# Patient Record
Sex: Male | Born: 1935 | Race: White | Hispanic: No | Smoking: Former smoker
Health system: Southern US, Community
[De-identification: ages and names within clinical notes are randomized; demographics above are authoritative.]

## PROBLEM LIST (undated history)

## (undated) DIAGNOSIS — I10 Essential (primary) hypertension: Secondary | ICD-10-CM

## (undated) DIAGNOSIS — K5792 Diverticulitis of intestine, part unspecified, without perforation or abscess without bleeding: Secondary | ICD-10-CM

## (undated) DIAGNOSIS — J449 Chronic obstructive pulmonary disease, unspecified: Secondary | ICD-10-CM

## (undated) DIAGNOSIS — K589 Irritable bowel syndrome without diarrhea: Secondary | ICD-10-CM

## (undated) HISTORY — PX: COLONOSCOPY: SHX174

---

## 2005-09-18 ENCOUNTER — Ambulatory Visit: Payer: Self-pay | Admitting: Unknown Physician Specialty

## 2005-09-24 ENCOUNTER — Ambulatory Visit: Payer: Self-pay | Admitting: Unknown Physician Specialty

## 2006-03-05 ENCOUNTER — Ambulatory Visit: Payer: Self-pay | Admitting: Unknown Physician Specialty

## 2006-05-28 ENCOUNTER — Ambulatory Visit: Payer: Self-pay | Admitting: Ophthalmology

## 2006-06-03 ENCOUNTER — Ambulatory Visit: Payer: Self-pay | Admitting: Ophthalmology

## 2006-11-11 ENCOUNTER — Ambulatory Visit: Payer: Self-pay | Admitting: Unknown Physician Specialty

## 2008-06-30 ENCOUNTER — Ambulatory Visit: Payer: Self-pay | Admitting: Family Medicine

## 2010-01-24 ENCOUNTER — Ambulatory Visit: Payer: Self-pay | Admitting: Family Medicine

## 2010-12-21 ENCOUNTER — Ambulatory Visit: Payer: Self-pay

## 2011-01-09 ENCOUNTER — Ambulatory Visit: Payer: Self-pay | Admitting: Pain Medicine

## 2011-01-22 ENCOUNTER — Ambulatory Visit: Payer: Self-pay | Admitting: Pain Medicine

## 2011-03-23 ENCOUNTER — Ambulatory Visit: Payer: Self-pay | Admitting: Family Medicine

## 2011-04-12 ENCOUNTER — Ambulatory Visit: Payer: Self-pay | Admitting: Pain Medicine

## 2011-05-07 ENCOUNTER — Ambulatory Visit: Payer: Self-pay | Admitting: Pain Medicine

## 2011-05-14 ENCOUNTER — Ambulatory Visit: Payer: Self-pay | Admitting: Unknown Physician Specialty

## 2012-12-04 ENCOUNTER — Ambulatory Visit: Payer: Self-pay | Admitting: Specialist

## 2012-12-16 ENCOUNTER — Ambulatory Visit: Payer: Self-pay | Admitting: Specialist

## 2012-12-31 ENCOUNTER — Ambulatory Visit: Payer: Self-pay | Admitting: Cardiothoracic Surgery

## 2013-01-08 ENCOUNTER — Other Ambulatory Visit (HOSPITAL_COMMUNITY): Payer: Self-pay | Admitting: Cardiothoracic Surgery

## 2013-01-08 DIAGNOSIS — R918 Other nonspecific abnormal finding of lung field: Secondary | ICD-10-CM

## 2013-01-08 DIAGNOSIS — C3490 Malignant neoplasm of unspecified part of unspecified bronchus or lung: Secondary | ICD-10-CM

## 2013-01-26 ENCOUNTER — Encounter (HOSPITAL_COMMUNITY): Payer: Self-pay | Admitting: Pharmacy Technician

## 2013-01-27 ENCOUNTER — Other Ambulatory Visit: Payer: Self-pay | Admitting: Radiology

## 2013-01-28 ENCOUNTER — Other Ambulatory Visit: Payer: Self-pay | Admitting: Radiology

## 2013-01-29 ENCOUNTER — Encounter (HOSPITAL_COMMUNITY): Payer: Self-pay

## 2013-01-29 ENCOUNTER — Ambulatory Visit (HOSPITAL_COMMUNITY)
Admission: RE | Admit: 2013-01-29 | Discharge: 2013-01-29 | Disposition: A | Discharge status: Home / Self Care | Payer: Medicare Other | Source: Ambulatory Visit | Attending: Cardiothoracic Surgery | Admitting: Cardiothoracic Surgery

## 2013-01-29 DIAGNOSIS — C3490 Malignant neoplasm of unspecified part of unspecified bronchus or lung: Secondary | ICD-10-CM | POA: Insufficient documentation

## 2013-01-29 DIAGNOSIS — J449 Chronic obstructive pulmonary disease, unspecified: Secondary | ICD-10-CM | POA: Insufficient documentation

## 2013-01-29 DIAGNOSIS — I10 Essential (primary) hypertension: Secondary | ICD-10-CM | POA: Insufficient documentation

## 2013-01-29 DIAGNOSIS — R911 Solitary pulmonary nodule: Secondary | ICD-10-CM | POA: Insufficient documentation

## 2013-01-29 DIAGNOSIS — R918 Other nonspecific abnormal finding of lung field: Secondary | ICD-10-CM

## 2013-01-29 DIAGNOSIS — J4489 Other specified chronic obstructive pulmonary disease: Secondary | ICD-10-CM | POA: Insufficient documentation

## 2013-01-29 HISTORY — DX: Chronic obstructive pulmonary disease, unspecified: J44.9

## 2013-01-29 HISTORY — DX: Essential (primary) hypertension: I10

## 2013-01-29 HISTORY — DX: Irritable bowel syndrome, unspecified: K58.9

## 2013-01-29 HISTORY — DX: Diverticulitis of intestine, part unspecified, without perforation or abscess without bleeding: K57.92

## 2013-01-29 LAB — CBC
HCT: 51.7 % (ref 39.0–52.0)
MCH: 32.4 pg (ref 26.0–34.0)
MCHC: 32.9 g/dL (ref 30.0–36.0)
MCV: 98.7 fL (ref 78.0–100.0)
Platelets: 272 10*3/uL (ref 150–400)
RDW: 12.8 % (ref 11.5–15.5)
WBC: 8.8 10*3/uL (ref 4.0–10.5)

## 2013-01-29 MED ORDER — FENTANYL CITRATE 0.05 MG/ML IJ SOLN
INTRAMUSCULAR | Status: AC | PRN
  Administered 2013-01-29: 100 ug via INTRAVENOUS

## 2013-01-29 MED ORDER — MIDAZOLAM HCL 2 MG/2ML IJ SOLN
INTRAMUSCULAR | Status: AC
  Filled 2013-01-29: qty 4

## 2013-01-29 MED ORDER — SODIUM CHLORIDE 0.9 % IV SOLN
Freq: Once | INTRAVENOUS | Status: AC
  Administered 2013-01-29: 11:00:00 via INTRAVENOUS

## 2013-01-29 MED ORDER — FENTANYL CITRATE 0.05 MG/ML IJ SOLN
INTRAMUSCULAR | Status: AC
  Filled 2013-01-29: qty 4

## 2013-01-29 MED ORDER — MIDAZOLAM HCL 2 MG/2ML IJ SOLN
INTRAMUSCULAR | Status: AC | PRN
  Administered 2013-01-29: 2 mg via INTRAVENOUS

## 2013-01-29 MED ORDER — HYDROCODONE-ACETAMINOPHEN 5-325 MG PO TABS
1.0000 | ORAL_TABLET | ORAL | Status: DC | PRN
  Filled 2013-01-29: qty 2

## 2013-01-29 NOTE — H&P (Signed)
Agree.  For biopsy of lingular lung nodule today.

## 2013-01-29 NOTE — H&P (Signed)
Russell Torres is an 79 y.o. male.   Chief Complaint: 2 incidents of hemoptysis over 1 mo ago None since Continued cough; sob Quit smoking 1.5 yrs ago Ct reveals B lung nodules Scheduled now for L lingular mass biopsy  HPI: No Ca Hx; COPD; HTN; IBS; Divertic; prev smoker  Past Medical History  Diagnosis Date  . COPD (chronic obstructive pulmonary disease)   . HTN (hypertension)   . Diverticulitis   . IBS (irritable bowel syndrome)     Past Surgical History  Procedure Date  . Colonoscopy     No family history on file. Social History:  reports that he quit smoking about 4 months ago. His smoking use included Cigarettes. He smoked 1 pack per day. He does not have any smokeless tobacco history on file. He reports that he drinks about 2.5 ounces of alcohol per week. He reports that he does not use illicit drugs.  Allergies: No Known Allergies   (Not in a hospital admission)  Results for orders placed during the hospital encounter of 01/29/13 (from the past 48 hour(s))  APTT     Status: Normal   Collection Time   01/29/13 11:25 AM      Component Value Range Comment   aPTT 31  24 - 37 seconds   CBC     Status: Normal   Collection Time   01/29/13 11:25 AM      Component Value Range Comment   WBC 8.8  4.0 - 10.5 K/uL    RBC 5.24  4.22 - 5.81 MIL/uL    Hemoglobin 17.0  13.0 - 17.0 g/dL    HCT 16.1  09.6 - 04.5 %    MCV 98.7  78.0 - 100.0 fL    MCH 32.4  26.0 - 34.0 pg    MCHC 32.9  30.0 - 36.0 g/dL    RDW 40.9  81.1 - 91.4 %    Platelets 272  150 - 400 K/uL   PROTIME-INR     Status: Normal   Collection Time   01/29/13 11:25 AM      Component Value Range Comment   Prothrombin Time 11.7  11.6 - 15.2 seconds    INR 0.86  0.00 - 1.49    No results found.  Review of Systems  Respiratory: Positive for cough, sputum production, shortness of breath and wheezing.   Cardiovascular: Positive for chest pain.  Gastrointestinal: Negative for nausea, vomiting and abdominal pain.   Neurological: Negative for headaches.    There were no vitals taken for this visit. Physical Exam  Constitutional: He is oriented to person, place, and time. He appears well-developed.  Cardiovascular: Normal rate, regular rhythm and normal heart sounds.   No murmur heard. Respiratory: Effort normal. He has wheezes.  GI: Soft. Bowel sounds are normal. He exhibits distension. There is no tenderness.  Musculoskeletal: Normal range of motion.  Neurological: He is alert and oriented to person, place, and time.  Skin: Skin is warm and dry.  Psychiatric: He has a normal mood and affect. His behavior is normal. Judgment and thought content normal.     Assessment/Plan B Lung nodules Hx hemoptysis 1 mo ago CT and PET + nodules Scheduled for L lingular mass bx Pt aware of procedure benefits and risks and agreeable to proceed Consent signed and in chart  Anasophia Pecor A 01/29/2013, 12:26 PM

## 2013-01-29 NOTE — Procedures (Signed)
Procedure:  CT guided core biopsy of left lung nodule Findings:  1.5 cm nodule in lingula sampled with 18 G core bx x 2 via 17 G needle.  Focal hemorrhage.  No PTX.

## 2013-02-05 ENCOUNTER — Ambulatory Visit: Payer: Self-pay | Admitting: Cardiothoracic Surgery

## 2013-02-17 ENCOUNTER — Ambulatory Visit: Payer: Self-pay

## 2013-02-17 LAB — COMPREHENSIVE METABOLIC PANEL
Albumin: 3.5 g/dL (ref 3.4–5.0)
Alkaline Phosphatase: 61 U/L (ref 50–136)
BUN: 14 mg/dL (ref 7–18)
Calcium, Total: 8.9 mg/dL (ref 8.5–10.1)
Chloride: 107 mmol/L (ref 98–107)
EGFR (Non-African Amer.): >60
Osmolality: 283 (ref 275–301)
Potassium: 4 mmol/L (ref 3.5–5.1)
SGOT(AST): 49 U/L — ABNORMAL HIGH (ref 15–37)

## 2013-02-17 LAB — APTT: Activated PTT: 30.9 secs (ref 23.6–35.9)

## 2013-02-17 LAB — CBC WITH DIFFERENTIAL/PLATELET
Basophil %: 0.5 %
HCT: 50.7 % (ref 40.0–52.0)
MCHC: 33.1 g/dL (ref 32.0–36.0)
MCV: 100 fL (ref 80–100)
Monocyte #: 0.6 x10 3/mm (ref 0.2–1.0)
RDW: 14.1 % (ref 11.5–14.5)
WBC: 7.6 10*3/uL (ref 3.8–10.6)

## 2013-02-18 ENCOUNTER — Ambulatory Visit: Payer: Self-pay

## 2013-02-23 LAB — COMPREHENSIVE METABOLIC PANEL
Albumin: 3.5 g/dL (ref 3.4–5.0)
Alkaline Phosphatase: 68 U/L (ref 50–136)
Anion Gap: 7 (ref 7–16)
BUN: 17 mg/dL (ref 7–18)
Bilirubin,Total: 0.3 mg/dL (ref 0.2–1.0)
Calcium, Total: 9.1 mg/dL (ref 8.5–10.1)
Chloride: 104 mmol/L (ref 98–107)
Co2: 28 mmol/L (ref 21–32)
Creatinine: 1.39 mg/dL — ABNORMAL HIGH (ref 0.60–1.30)
EGFR (African American): 57 — ABNORMAL LOW
EGFR (Non-African Amer.): 49 — ABNORMAL LOW
Glucose: 149 mg/dL — ABNORMAL HIGH (ref 65–99)
Osmolality: 282 (ref 275–301)
Potassium: 4.5 mmol/L (ref 3.5–5.1)
SGOT(AST): 51 U/L — ABNORMAL HIGH (ref 15–37)
SGPT (ALT): 44 U/L (ref 12–78)
Sodium: 139 mmol/L (ref 136–145)
Total Protein: 7.8 g/dL (ref 6.4–8.2)

## 2013-02-23 LAB — CBC CANCER CENTER
Basophil #: 0 x10 3/mm (ref 0.0–0.1)
Basophil %: 0.3 %
Eosinophil #: 0 x10 3/mm (ref 0.0–0.7)
Eosinophil %: 0 %
HCT: 50.5 % (ref 40.0–52.0)
HGB: 16.9 g/dL (ref 13.0–18.0)
Lymphocyte #: 0.5 x10 3/mm — ABNORMAL LOW (ref 1.0–3.6)
Lymphocyte %: 8.4 %
MCH: 33.3 pg (ref 26.0–34.0)
MCHC: 33.5 g/dL (ref 32.0–36.0)
MCV: 99 fL (ref 80–100)
Monocyte #: 0 x10 3/mm — ABNORMAL LOW (ref 0.2–1.0)
Monocyte %: 0.7 %
Neutrophil #: 5.5 x10 3/mm (ref 1.4–6.5)
Neutrophil %: 90.6 %
Platelet: 196 x10 3/mm (ref 150–440)
RBC: 5.08 10*6/uL (ref 4.40–5.90)
RDW: 13.6 % (ref 11.5–14.5)
WBC: 6.1 x10 3/mm (ref 3.8–10.6)

## 2013-02-28 ENCOUNTER — Ambulatory Visit: Payer: Self-pay | Admitting: Cardiothoracic Surgery

## 2013-03-02 LAB — CBC CANCER CENTER
Basophil %: 0.5 %
Eosinophil %: 5.3 %
HCT: 47.9 % (ref 40.0–52.0)
Lymphocyte %: 22.2 %
MCV: 99 fL (ref 80–100)
Monocyte %: 12.3 %
Neutrophil #: 3.8 x10 3/mm (ref 1.4–6.5)
RBC: 4.85 10*6/uL (ref 4.40–5.90)
RDW: 13.6 % (ref 11.5–14.5)
WBC: 6.4 x10 3/mm (ref 3.8–10.6)

## 2013-03-02 LAB — CALCIUM: Calcium, Total: 9.3 mg/dL (ref 8.5–10.1)

## 2013-03-02 LAB — POTASSIUM: Potassium: 4.5 mmol/L (ref 3.5–5.1)

## 2013-03-09 LAB — CBC CANCER CENTER
Basophil #: 0.1 x10 3/mm (ref 0.0–0.1)
Basophil %: 0.9 %
HCT: 47.1 % (ref 40.0–52.0)
Lymphocyte #: 1.7 x10 3/mm (ref 1.0–3.6)
MCHC: 32.4 g/dL (ref 32.0–36.0)
MCV: 99 fL (ref 80–100)
Monocyte #: 1 x10 3/mm (ref 0.2–1.0)
Monocyte %: 6.7 %
Neutrophil #: 12.1 x10 3/mm — ABNORMAL HIGH (ref 1.4–6.5)
Neutrophil %: 80.5 %
Platelet: 196 x10 3/mm (ref 150–440)
RDW: 13.9 % (ref 11.5–14.5)
WBC: 15 x10 3/mm — ABNORMAL HIGH (ref 3.8–10.6)

## 2013-03-09 LAB — BASIC METABOLIC PANEL
Chloride: 101 mmol/L (ref 98–107)
Creatinine: 1.37 mg/dL — ABNORMAL HIGH (ref 0.60–1.30)
Glucose: 114 mg/dL — ABNORMAL HIGH (ref 65–99)
Osmolality: 277 (ref 275–301)
Sodium: 139 mmol/L (ref 136–145)

## 2013-03-09 LAB — HEPATIC FUNCTION PANEL A (ARMC)
Bilirubin,Total: 0.3 mg/dL (ref 0.2–1.0)
SGOT(AST): 16 U/L (ref 15–37)
SGPT (ALT): 25 U/L (ref 12–78)

## 2013-03-16 LAB — CBC CANCER CENTER
Basophil #: 0.1 x10 3/mm (ref 0.0–0.1)
Basophil %: 1.9 %
Eosinophil %: 0.1 %
HCT: 43.8 % (ref 40.0–52.0)
Lymphocyte %: 8.2 %
MCV: 97 fL (ref 80–100)
Monocyte %: 0.2 %
Neutrophil #: 6.5 x10 3/mm (ref 1.4–6.5)
Neutrophil %: 89.6 %
Platelet: 400 x10 3/mm (ref 150–440)
RBC: 4.5 10*6/uL (ref 4.40–5.90)
RDW: 13.4 % (ref 11.5–14.5)

## 2013-03-16 LAB — BASIC METABOLIC PANEL
Anion Gap: 8 (ref 7–16)
BUN: 14 mg/dL (ref 7–18)
Co2: 28 mmol/L (ref 21–32)
EGFR (African American): 57 — ABNORMAL LOW
EGFR (Non-African Amer.): 49 — ABNORMAL LOW
Osmolality: 281 (ref 275–301)

## 2013-03-23 LAB — CBC CANCER CENTER
Basophil %: 1.1 %
Eosinophil #: 0.4 x10 3/mm (ref 0.0–0.7)
HCT: 44.7 % (ref 40.0–52.0)
HGB: 15 g/dL (ref 13.0–18.0)
MCHC: 33.6 g/dL (ref 32.0–36.0)
MCV: 97 fL (ref 80–100)
Monocyte %: 17.6 %
Neutrophil %: 45.1 %
Platelet: 142 x10 3/mm — ABNORMAL LOW (ref 150–440)
RBC: 4.61 10*6/uL (ref 4.40–5.90)
RDW: 13.5 % (ref 11.5–14.5)
WBC: 4.8 x10 3/mm (ref 3.8–10.6)

## 2013-03-30 LAB — CBC CANCER CENTER
Basophil #: 0.2 x10 3/mm — ABNORMAL HIGH (ref 0.0–0.1)
Eosinophil #: 0.3 x10 3/mm (ref 0.0–0.7)
Eosinophil %: 2.2 %
Lymphocyte #: 1.9 x10 3/mm (ref 1.0–3.6)
MCHC: 32.4 g/dL (ref 32.0–36.0)
MCV: 97 fL (ref 80–100)
Monocyte %: 4.5 %
RDW: 13.3 % (ref 11.5–14.5)
WBC: 14.3 x10 3/mm — ABNORMAL HIGH (ref 3.8–10.6)

## 2013-03-30 LAB — HEPATIC FUNCTION PANEL A (ARMC)
Albumin: 2.9 g/dL — ABNORMAL LOW (ref 3.4–5.0)
Bilirubin,Total: 0.2 mg/dL (ref 0.2–1.0)
SGOT(AST): 14 U/L — ABNORMAL LOW (ref 15–37)
SGPT (ALT): 20 U/L (ref 12–78)
Total Protein: 7.1 g/dL (ref 6.4–8.2)

## 2013-03-30 LAB — BASIC METABOLIC PANEL
Anion Gap: 9 (ref 7–16)
BUN: 14 mg/dL (ref 7–18)
Co2: 29 mmol/L (ref 21–32)
EGFR (African American): >60
Glucose: 102 mg/dL — ABNORMAL HIGH (ref 65–99)
Osmolality: 280 (ref 275–301)
Potassium: 4.2 mmol/L (ref 3.5–5.1)
Sodium: 140 mmol/L (ref 136–145)

## 2013-03-31 ENCOUNTER — Ambulatory Visit: Payer: Self-pay | Admitting: Cardiothoracic Surgery

## 2013-04-06 LAB — CBC CANCER CENTER
Basophil %: 0.8 %
Eosinophil #: 0 x10 3/mm (ref 0.0–0.7)
HCT: 42.7 % (ref 40.0–52.0)
Lymphocyte %: 8.8 %
MCH: 31.8 pg (ref 26.0–34.0)
MCHC: 32.9 g/dL (ref 32.0–36.0)
Monocyte #: 0.1 x10 3/mm — ABNORMAL LOW (ref 0.2–1.0)
Monocyte %: 0.5 %
Neutrophil #: 9.4 x10 3/mm — ABNORMAL HIGH (ref 1.4–6.5)
Neutrophil %: 89.9 %
Platelet: 450 x10 3/mm — ABNORMAL HIGH (ref 150–440)
RBC: 4.42 10*6/uL (ref 4.40–5.90)
WBC: 10.5 x10 3/mm (ref 3.8–10.6)

## 2013-04-06 LAB — BASIC METABOLIC PANEL
BUN: 21 mg/dL — ABNORMAL HIGH (ref 7–18)
Chloride: 103 mmol/L (ref 98–107)
Co2: 28 mmol/L (ref 21–32)
Creatinine: 1.48 mg/dL — ABNORMAL HIGH (ref 0.60–1.30)
EGFR (African American): 53 — ABNORMAL LOW
EGFR (Non-African Amer.): 45 — ABNORMAL LOW
Glucose: 176 mg/dL — ABNORMAL HIGH (ref 65–99)

## 2013-04-13 LAB — CBC CANCER CENTER
Eosinophil #: 0.4 x10 3/mm (ref 0.0–0.7)
Eosinophil %: 7.3 %
HCT: 41.9 % (ref 40.0–52.0)
HGB: 13.8 g/dL (ref 13.0–18.0)
Lymphocyte #: 1.8 x10 3/mm (ref 1.0–3.6)
Lymphocyte %: 31.3 %
MCH: 31.9 pg (ref 26.0–34.0)
MCV: 97 fL (ref 80–100)
Monocyte #: 0.8 x10 3/mm (ref 0.2–1.0)
Neutrophil #: 2.6 x10 3/mm (ref 1.4–6.5)
Neutrophil %: 46.7 %
RBC: 4.33 10*6/uL — ABNORMAL LOW (ref 4.40–5.90)
RDW: 13.6 % (ref 11.5–14.5)
WBC: 5.6 x10 3/mm (ref 3.8–10.6)

## 2013-04-20 LAB — HEPATIC FUNCTION PANEL A (ARMC)
Albumin: 3.4 g/dL (ref 3.4–5.0)
Alkaline Phosphatase: 101 U/L (ref 50–136)
Bilirubin,Total: 0.2 mg/dL (ref 0.2–1.0)
SGPT (ALT): 25 U/L (ref 12–78)
Total Protein: 7.1 g/dL (ref 6.4–8.2)

## 2013-04-20 LAB — CBC CANCER CENTER
Basophil #: 0.1 x10 3/mm (ref 0.0–0.1)
Basophil %: 0.6 %
HGB: 13.1 g/dL (ref 13.0–18.0)
Lymphocyte #: 2.1 x10 3/mm (ref 1.0–3.6)
MCH: 31.5 pg (ref 26.0–34.0)
MCHC: 32.4 g/dL (ref 32.0–36.0)
MCV: 97 fL (ref 80–100)
Monocyte #: 0.8 x10 3/mm (ref 0.2–1.0)
Neutrophil #: 8.8 x10 3/mm — ABNORMAL HIGH (ref 1.4–6.5)
Neutrophil %: 72.8 %
Platelet: 207 x10 3/mm (ref 150–440)
RBC: 4.17 10*6/uL — ABNORMAL LOW (ref 4.40–5.90)

## 2013-04-20 LAB — CREATININE, SERUM
Creatinine: 1.15 mg/dL (ref 0.60–1.30)
EGFR (African American): >60

## 2013-04-27 LAB — CBC CANCER CENTER
Basophil #: 0 x10 3/mm (ref 0.0–0.1)
Basophil %: 0.5 %
Eosinophil #: 0 x10 3/mm (ref 0.0–0.7)
Eosinophil %: 0 %
HCT: 40.5 % (ref 40.0–52.0)
HGB: 13.5 g/dL (ref 13.0–18.0)
Lymphocyte #: 0.7 x10 3/mm — ABNORMAL LOW (ref 1.0–3.6)
Lymphocyte %: 24 %
MCH: 32 pg (ref 26.0–34.0)
MCHC: 33.3 g/dL (ref 32.0–36.0)
Monocyte #: 0 x10 3/mm — ABNORMAL LOW (ref 0.2–1.0)
Neutrophil #: 2.1 x10 3/mm (ref 1.4–6.5)
Neutrophil %: 74.7 %
Platelet: 341 x10 3/mm (ref 150–440)
RBC: 4.21 10*6/uL — ABNORMAL LOW (ref 4.40–5.90)
WBC: 2.8 x10 3/mm — ABNORMAL LOW (ref 3.8–10.6)

## 2013-04-27 LAB — BASIC METABOLIC PANEL
Anion Gap: 11 (ref 7–16)
BUN: 16 mg/dL (ref 7–18)
Chloride: 101 mmol/L (ref 98–107)
Co2: 28 mmol/L (ref 21–32)
Creatinine: 1.38 mg/dL — ABNORMAL HIGH (ref 0.60–1.30)
EGFR (Non-African Amer.): 49 — ABNORMAL LOW
Glucose: 163 mg/dL — ABNORMAL HIGH (ref 65–99)

## 2013-04-30 ENCOUNTER — Ambulatory Visit: Payer: Self-pay | Admitting: Cardiothoracic Surgery

## 2013-05-04 LAB — CBC CANCER CENTER
Basophil #: 0 x10 3/mm (ref 0.0–0.1)
HGB: 12.3 g/dL — ABNORMAL LOW (ref 13.0–18.0)
MCH: 31.5 pg (ref 26.0–34.0)
Monocyte #: 0.7 x10 3/mm (ref 0.2–1.0)
Platelet: 115 x10 3/mm — ABNORMAL LOW (ref 150–440)
RBC: 3.91 10*6/uL — ABNORMAL LOW (ref 4.40–5.90)

## 2013-05-11 LAB — CBC CANCER CENTER
Basophil #: 0.1 x10 3/mm (ref 0.0–0.1)
Basophil %: 0.4 %
Eosinophil %: 3.1 %
HCT: 34.8 % — ABNORMAL LOW (ref 40.0–52.0)
Lymphocyte #: 1.9 x10 3/mm (ref 1.0–3.6)
Lymphocyte %: 13.6 %
MCV: 97 fL (ref 80–100)
Monocyte #: 1 x10 3/mm (ref 0.2–1.0)
RBC: 3.6 10*6/uL — ABNORMAL LOW (ref 4.40–5.90)
RDW: 15 % — ABNORMAL HIGH (ref 11.5–14.5)
WBC: 14 x10 3/mm — ABNORMAL HIGH (ref 3.8–10.6)

## 2013-05-18 LAB — HEPATIC FUNCTION PANEL A (ARMC)
Alkaline Phosphatase: 80 U/L (ref 50–136)
Bilirubin, Direct: 0.1 mg/dL (ref 0.00–0.20)
Bilirubin,Total: 0.4 mg/dL (ref 0.2–1.0)
SGOT(AST): 16 U/L (ref 15–37)
SGPT (ALT): 21 U/L (ref 12–78)
Total Protein: 7.1 g/dL (ref 6.4–8.2)

## 2013-05-18 LAB — CBC CANCER CENTER
Basophil #: 0 x10 3/mm (ref 0.0–0.1)
Eosinophil #: 0.2 x10 3/mm (ref 0.0–0.7)
HGB: 12.3 g/dL — ABNORMAL LOW (ref 13.0–18.0)
Lymphocyte #: 2.2 x10 3/mm (ref 1.0–3.6)
MCH: 32.3 pg (ref 26.0–34.0)
MCHC: 33.3 g/dL (ref 32.0–36.0)
MCV: 97 fL (ref 80–100)
Monocyte %: 9.7 %
Neutrophil #: 4.7 x10 3/mm (ref 1.4–6.5)
RBC: 3.8 10*6/uL — ABNORMAL LOW (ref 4.40–5.90)
WBC: 7.8 x10 3/mm (ref 3.8–10.6)

## 2013-05-18 LAB — BASIC METABOLIC PANEL
Chloride: 102 mmol/L (ref 98–107)
Co2: 30 mmol/L (ref 21–32)
Glucose: 106 mg/dL — ABNORMAL HIGH (ref 65–99)
Osmolality: 284 (ref 275–301)
Potassium: 4 mmol/L (ref 3.5–5.1)
Sodium: 142 mmol/L (ref 136–145)

## 2013-05-26 LAB — CBC CANCER CENTER
Basophil #: 0.1 x10 3/mm (ref 0.0–0.1)
Basophil %: 0.9 %
HCT: 32.4 % — ABNORMAL LOW (ref 40.0–52.0)
HGB: 10.6 g/dL — ABNORMAL LOW (ref 13.0–18.0)
Lymphocyte %: 17.7 %
MCH: 32 pg (ref 26.0–34.0)
MCV: 98 fL (ref 80–100)
Monocyte #: 1.5 x10 3/mm — ABNORMAL HIGH (ref 0.2–1.0)
Monocyte %: 11.9 %
Neutrophil %: 66.6 %
RBC: 3.32 10*6/uL — ABNORMAL LOW (ref 4.40–5.90)

## 2013-05-31 ENCOUNTER — Ambulatory Visit: Payer: Self-pay | Admitting: Cardiothoracic Surgery

## 2013-06-01 LAB — CBC CANCER CENTER
Basophil %: 0.2 %
Eosinophil #: 0.5 x10 3/mm (ref 0.0–0.7)
HCT: 34.5 % — ABNORMAL LOW (ref 40.0–52.0)
HGB: 11.4 g/dL — ABNORMAL LOW (ref 13.0–18.0)
Lymphocyte #: 2.3 x10 3/mm (ref 1.0–3.6)
Lymphocyte %: 20 %
MCHC: 33.1 g/dL (ref 32.0–36.0)
Monocyte #: 0.9 x10 3/mm (ref 0.2–1.0)
Monocyte %: 8.1 %
RBC: 3.5 10*6/uL — ABNORMAL LOW (ref 4.40–5.90)

## 2013-06-08 LAB — HEPATIC FUNCTION PANEL A (ARMC)
Albumin: 3.4 g/dL (ref 3.4–5.0)
Alkaline Phosphatase: 77 U/L (ref 50–136)
Bilirubin,Total: 0.4 mg/dL (ref 0.2–1.0)
SGOT(AST): 15 U/L (ref 15–37)
SGPT (ALT): 20 U/L (ref 12–78)
Total Protein: 7.2 g/dL (ref 6.4–8.2)

## 2013-06-08 LAB — CBC CANCER CENTER
Basophil #: 0.1 x10 3/mm (ref 0.0–0.1)
Eosinophil #: 0 x10 3/mm (ref 0.0–0.7)
Eosinophil %: 0 %
HGB: 12.5 g/dL — ABNORMAL LOW (ref 13.0–18.0)
Lymphocyte #: 0.7 x10 3/mm — ABNORMAL LOW (ref 1.0–3.6)
MCH: 33.7 pg (ref 26.0–34.0)
MCV: 99 fL (ref 80–100)
Monocyte %: 0.5 %
Neutrophil #: 5.5 x10 3/mm (ref 1.4–6.5)
Platelet: 380 x10 3/mm (ref 150–440)
RBC: 3.73 10*6/uL — ABNORMAL LOW (ref 4.40–5.90)
RDW: 17 % — ABNORMAL HIGH (ref 11.5–14.5)

## 2013-06-08 LAB — BASIC METABOLIC PANEL
Calcium, Total: 9.2 mg/dL (ref 8.5–10.1)
Co2: 29 mmol/L (ref 21–32)
EGFR (African American): 53 — ABNORMAL LOW
Glucose: 163 mg/dL — ABNORMAL HIGH (ref 65–99)
Osmolality: 282 (ref 275–301)
Potassium: 4.3 mmol/L (ref 3.5–5.1)
Sodium: 138 mmol/L (ref 136–145)

## 2013-06-15 LAB — CBC CANCER CENTER
Basophil #: 0.1 x10 3/mm (ref 0.0–0.1)
Eosinophil %: 6.4 %
HCT: 35.1 % — ABNORMAL LOW (ref 40.0–52.0)
HGB: 12 g/dL — ABNORMAL LOW (ref 13.0–18.0)
Lymphocyte #: 1.9 x10 3/mm (ref 1.0–3.6)
Lymphocyte %: 28.6 %
MCH: 34.3 pg — ABNORMAL HIGH (ref 26.0–34.0)
MCHC: 34.1 g/dL (ref 32.0–36.0)
Monocyte #: 0.8 x10 3/mm (ref 0.2–1.0)
Monocyte %: 11.8 %
Neutrophil #: 3.5 x10 3/mm (ref 1.4–6.5)
Neutrophil %: 52.1 %
Platelet: 115 x10 3/mm — ABNORMAL LOW (ref 150–440)
RBC: 3.48 10*6/uL — ABNORMAL LOW (ref 4.40–5.90)
WBC: 6.8 x10 3/mm (ref 3.8–10.6)

## 2013-06-22 LAB — CBC CANCER CENTER
Basophil #: 0.1 x10 3/mm (ref 0.0–0.1)
HCT: 34.6 % — ABNORMAL LOW (ref 40.0–52.0)
Lymphocyte %: 17.5 %
MCH: 33.9 pg (ref 26.0–34.0)
MCHC: 33.4 g/dL (ref 32.0–36.0)
MCV: 102 fL — ABNORMAL HIGH (ref 80–100)
Monocyte #: 0.7 x10 3/mm (ref 0.2–1.0)
Neutrophil #: 8.4 x10 3/mm — ABNORMAL HIGH (ref 1.4–6.5)
Neutrophil %: 72 %
Platelet: 252 x10 3/mm (ref 150–440)
RBC: 3.41 10*6/uL — ABNORMAL LOW (ref 4.40–5.90)
RDW: 16.6 % — ABNORMAL HIGH (ref 11.5–14.5)

## 2013-06-22 LAB — CREATININE, SERUM
EGFR (African American): >60
EGFR (Non-African Amer.): 57 — ABNORMAL LOW

## 2013-06-22 LAB — HEPATIC FUNCTION PANEL A (ARMC)
Albumin: 3.4 g/dL (ref 3.4–5.0)
Bilirubin,Total: 0.3 mg/dL (ref 0.2–1.0)
SGPT (ALT): 22 U/L (ref 12–78)

## 2013-06-30 ENCOUNTER — Ambulatory Visit: Payer: Self-pay | Admitting: Cardiothoracic Surgery

## 2013-07-31 ENCOUNTER — Ambulatory Visit: Payer: Self-pay | Admitting: Cardiothoracic Surgery

## 2013-09-01 ENCOUNTER — Ambulatory Visit: Payer: Self-pay | Admitting: Internal Medicine

## 2013-09-30 ENCOUNTER — Ambulatory Visit: Payer: Self-pay | Admitting: Internal Medicine

## 2013-10-12 LAB — CBC CANCER CENTER
Basophil #: 0.1 x10 3/mm (ref 0.0–0.1)
Basophil %: 0.8 %
Eosinophil #: 0.2 x10 3/mm (ref 0.0–0.7)
HCT: 43.7 % (ref 40.0–52.0)
HGB: 14.7 g/dL (ref 13.0–18.0)
Lymphocyte #: 2.1 x10 3/mm (ref 1.0–3.6)
MCH: 33.2 pg (ref 26.0–34.0)
MCHC: 33.8 g/dL (ref 32.0–36.0)
Monocyte #: 0.6 x10 3/mm (ref 0.2–1.0)
Monocyte %: 8 %
Neutrophil #: 4.8 x10 3/mm (ref 1.4–6.5)
Platelet: 227 x10 3/mm (ref 150–440)
RBC: 4.44 10*6/uL (ref 4.40–5.90)
WBC: 7.8 x10 3/mm (ref 3.8–10.6)

## 2013-10-12 LAB — HEPATIC FUNCTION PANEL A (ARMC)
Alkaline Phosphatase: 75 U/L (ref 50–136)
Bilirubin,Total: 0.7 mg/dL (ref 0.2–1.0)
Total Protein: 6.8 g/dL (ref 6.4–8.2)

## 2013-10-12 LAB — CREATININE, SERUM: EGFR (African American): 53 — ABNORMAL LOW

## 2013-10-31 ENCOUNTER — Ambulatory Visit: Payer: Self-pay | Admitting: Internal Medicine

## 2013-11-30 ENCOUNTER — Ambulatory Visit: Payer: Self-pay | Admitting: Internal Medicine

## 2014-01-04 ENCOUNTER — Ambulatory Visit: Payer: Self-pay | Admitting: Internal Medicine

## 2014-01-31 ENCOUNTER — Ambulatory Visit: Payer: Self-pay | Admitting: Internal Medicine

## 2014-02-15 LAB — HEPATIC FUNCTION PANEL A (ARMC)
AST: 34 U/L (ref 15–37)
Albumin: 3.4 g/dL (ref 3.4–5.0)
Alkaline Phosphatase: 64 U/L
BILIRUBIN DIRECT: 0.2 mg/dL (ref 0.00–0.20)
Bilirubin,Total: 0.7 mg/dL (ref 0.2–1.0)
SGPT (ALT): 34 U/L (ref 12–78)
Total Protein: 7.2 g/dL (ref 6.4–8.2)

## 2014-02-15 LAB — CBC CANCER CENTER
BASOS ABS: 0.1 x10 3/mm (ref 0.0–0.1)
BASOS PCT: 1.2 %
Eosinophil #: 0.2 x10 3/mm (ref 0.0–0.7)
Eosinophil %: 2.5 %
HCT: 47.6 % (ref 40.0–52.0)
HGB: 15.4 g/dL (ref 13.0–18.0)
Lymphocyte #: 1.9 x10 3/mm (ref 1.0–3.6)
Lymphocyte %: 21.3 %
MCH: 32.7 pg (ref 26.0–34.0)
MCHC: 32.4 g/dL (ref 32.0–36.0)
MCV: 101 fL — ABNORMAL HIGH (ref 80–100)
Monocyte #: 0.7 x10 3/mm (ref 0.2–1.0)
Monocyte %: 8 %
Neutrophil #: 6.1 x10 3/mm (ref 1.4–6.5)
Neutrophil %: 67 %
Platelet: 243 x10 3/mm (ref 150–440)
RBC: 4.71 10*6/uL (ref 4.40–5.90)
RDW: 13.8 % (ref 11.5–14.5)
WBC: 9.1 x10 3/mm (ref 3.8–10.6)

## 2014-02-15 LAB — CREATININE, SERUM
CREATININE: 1.35 mg/dL — AB (ref 0.60–1.30)
EGFR (African American): 58 — ABNORMAL LOW
EGFR (Non-African Amer.): 50 — ABNORMAL LOW

## 2014-02-28 ENCOUNTER — Ambulatory Visit: Payer: Self-pay | Admitting: Internal Medicine

## 2014-03-31 ENCOUNTER — Ambulatory Visit: Payer: Self-pay | Admitting: Internal Medicine

## 2014-05-07 ENCOUNTER — Ambulatory Visit: Payer: Self-pay | Admitting: Internal Medicine

## 2014-05-31 ENCOUNTER — Ambulatory Visit: Payer: Self-pay | Admitting: Internal Medicine

## 2014-05-31 DEATH — deceased

## 2014-06-08 IMAGING — CT NM PET TUM IMG LTD AREA
1 of 5 series · 8 of 25 positions shown · non-contrast
Comparison: none

REASON FOR EXAM: multiple small nodules
COMMENTS:

PROCEDURE:     PET - PET/CT DX LUNG CA  - December 16, 2012 [DATE]
RESULT:     Indication: Pulmonary nodules
Radiopharmaceutical: 12.62 mCi F18-FDG, intravenously.
TECHNIQUE: Imaging was performed from the skull base to the mid-thigh using
routine PET/CT acquisition protocol.
Injection site: Left antecubital
Time of FDG injection: 0260 hours
Serum glucose: 96 mg/dL
Time of imaging: 3233 hours
Comparison studies: NONE

[Series 3: ct wb 3.0 b30f · axial · 3.0mm · 0.98mm/px · z∈[+38,+806]mm · 8 of 494 slices shown]
[im 55/494  soft-tissue]
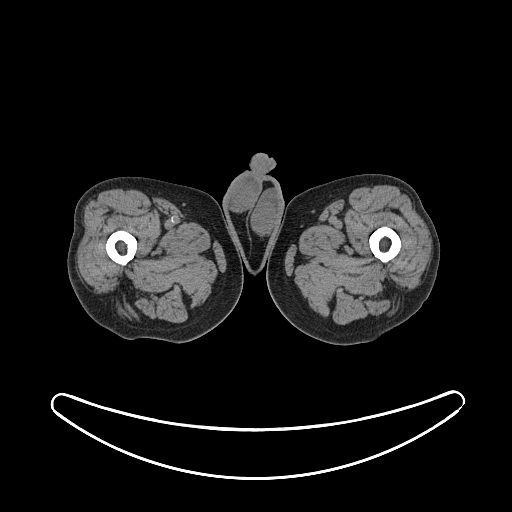
[im 110/494  soft-tissue]
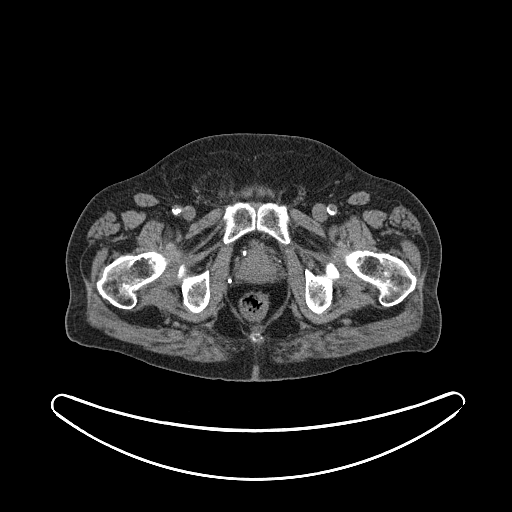
[im 165/494  soft-tissue]
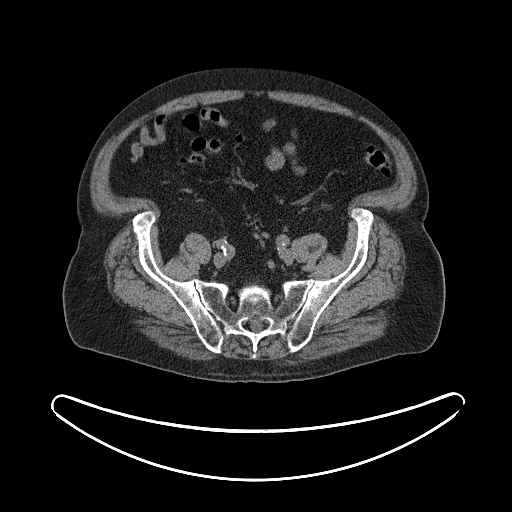
[im 220/494  soft-tissue]
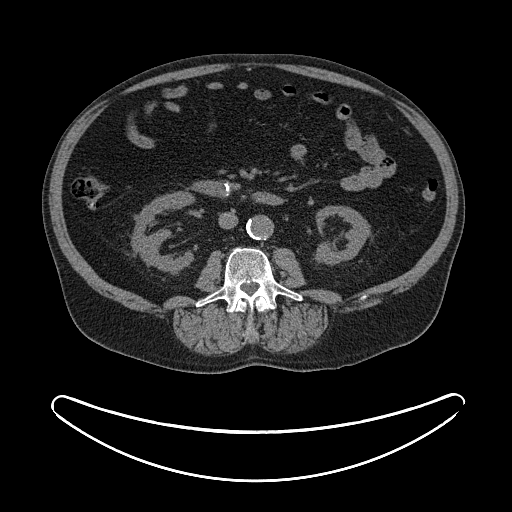
[im 274/494  soft-tissue]
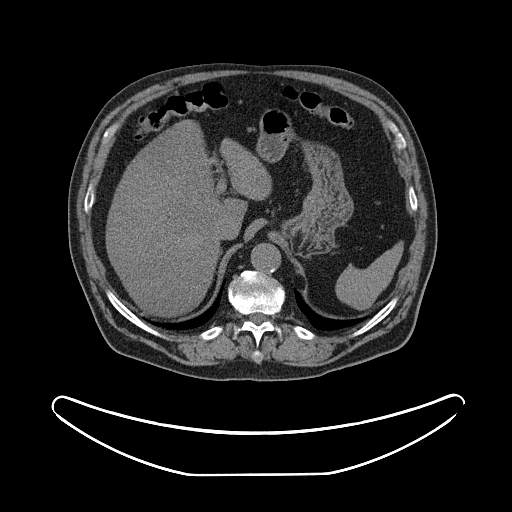
[im 329/494  soft-tissue]
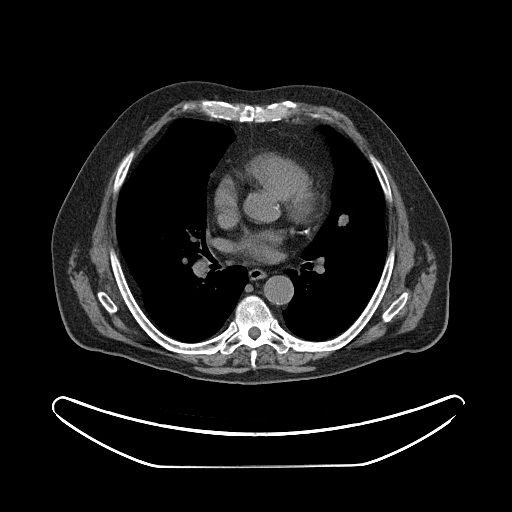
[im 384/494  soft-tissue]
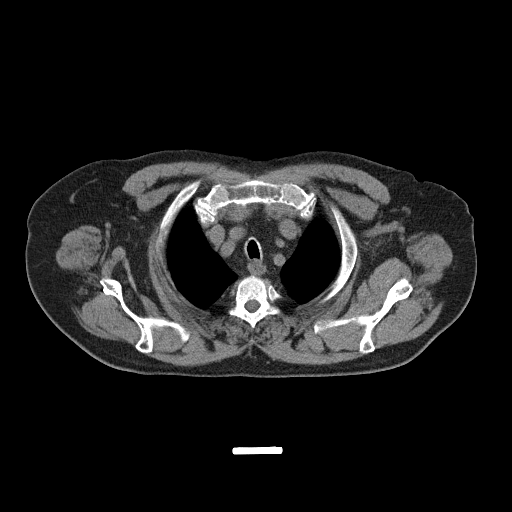
[im 439/494  soft-tissue]
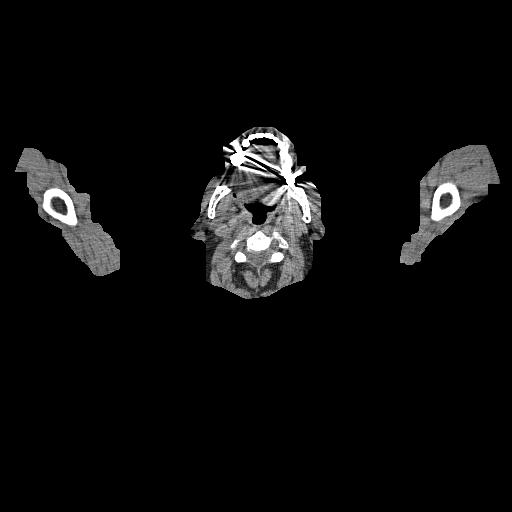

[8 of 25 positions shown; findings below may reference images not displayed]

FINDINGS: HEAD AND NECK:

There is no abnormal hypermetabolic activity in the head and neck. There is
no cervical soft tissue mass or lymphadenopathy.

CHEST:

There is a spiculated 15 mm left upper lobe pulmonary nodule. an SUV max of
4.6 and an SUV average of 2.9 . There is a 15 mm right lower lobe pulmonary
nodule weighted an SUV max of 4.9 and an SUV average of 3 . There is a 11 mm
hypermetabolic right upper lobe pulmonary nodule with an SUV max of 2.3 and
an SUV average of 1.2 .

The heart size is normal. There is no pericardial effusion. There is dense
coronary artery atherosclerosis involving the LAD and circumflex coronary
arteries.

There no pathologically enlarged mediastinal, hilar, or axillary lymph
nodes.

The osseous structures demonstrate no focal abnormality.

ABDOMEN/PELVIS:

The liver demonstrates no focal abnormality. The gallbladder is
unremarkable. The spleen demonstrates no focal abnormality. The kidneys,
adrenal glands, pancreas are normal. The bladder is unremarkable.

There is a fat-containing ventral abdominal hernia. The unopacified bowel is
unremarkable. There is diverticulosis without evidence of diverticulitis.
There is no pneumoperitoneum, pneumatosis, or portal venous gas. There is a
fat containing right inguinal hernia. There is no abdominal or pelvic free
fluid. There is no lymphadenopathy.

The abdominal aorta is normal in caliber.

The osseous structures are unremarkable.
IMPRESSION: 1. There are hypermetabolic polar nodules involving the right upper lobe,
right lower lobe and left upper lobe as described above. Differential
diagnosis includes an infectious/inflammatory process versus malignancy
(synchronous primaries versus metastatic disease).

2. Coronary artery disease.

[REDACTED]

## 2014-08-10 IMAGING — CR DG CHEST 2V
1 series · 2 of 2 positions shown · non-contrast
Comparison: none

REASON FOR EXAM: copd,cad,diabetes
COMMENTS:

PROCEDURE:     DXR - DXR CHEST PA (OR AP) AND LATERAL  - February 17, 2013  [DATE]
RESULT:     The lungs are clear. The heart and pulmonary vessels are normal.
The bony and mediastinal structures are unremarkable. There is no effusion.
There is no pneumothorax or evidence of congestive failure.

[Series 1: w chest pa · 0.14mm/px · 2 of 2 slices shown]
[im 1/2]
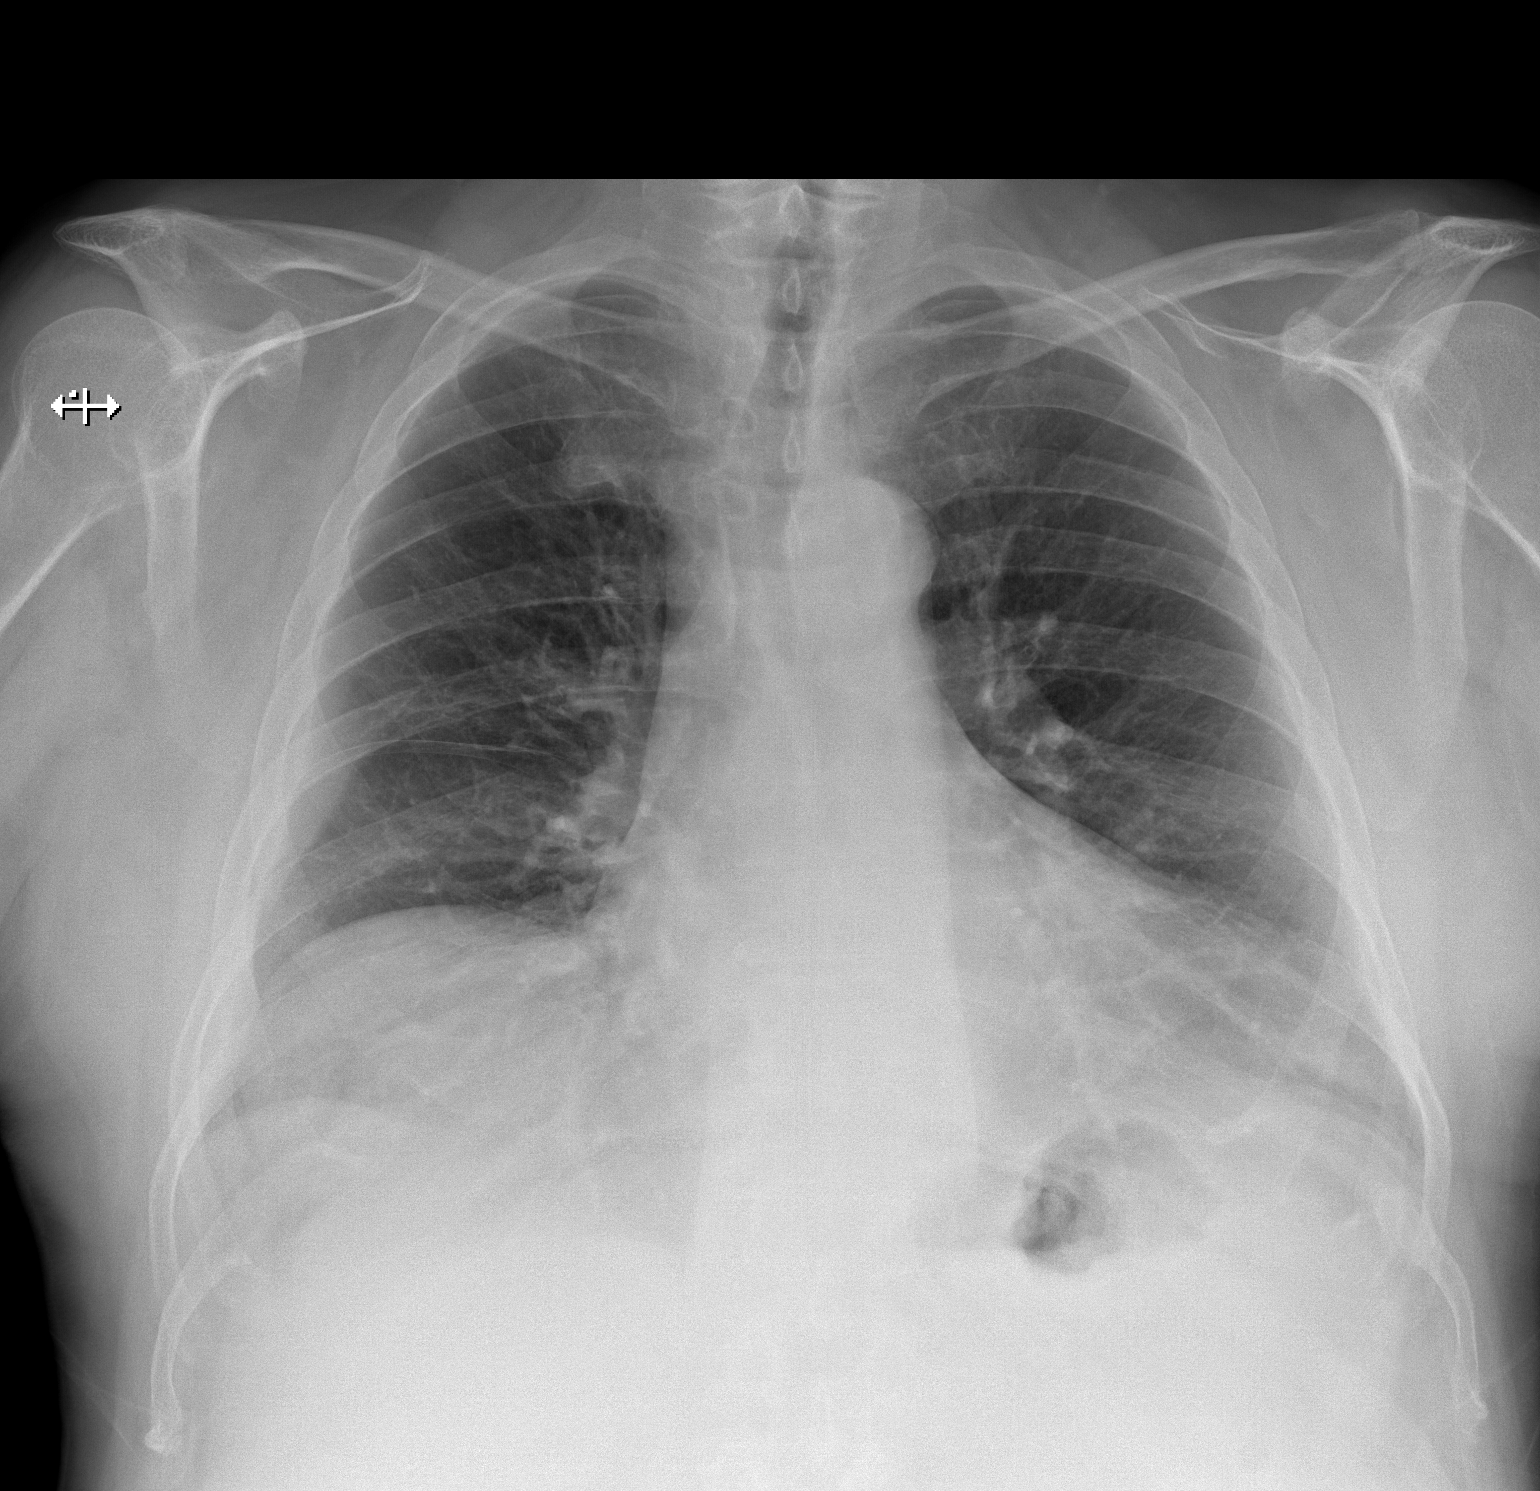
[im 2/2]
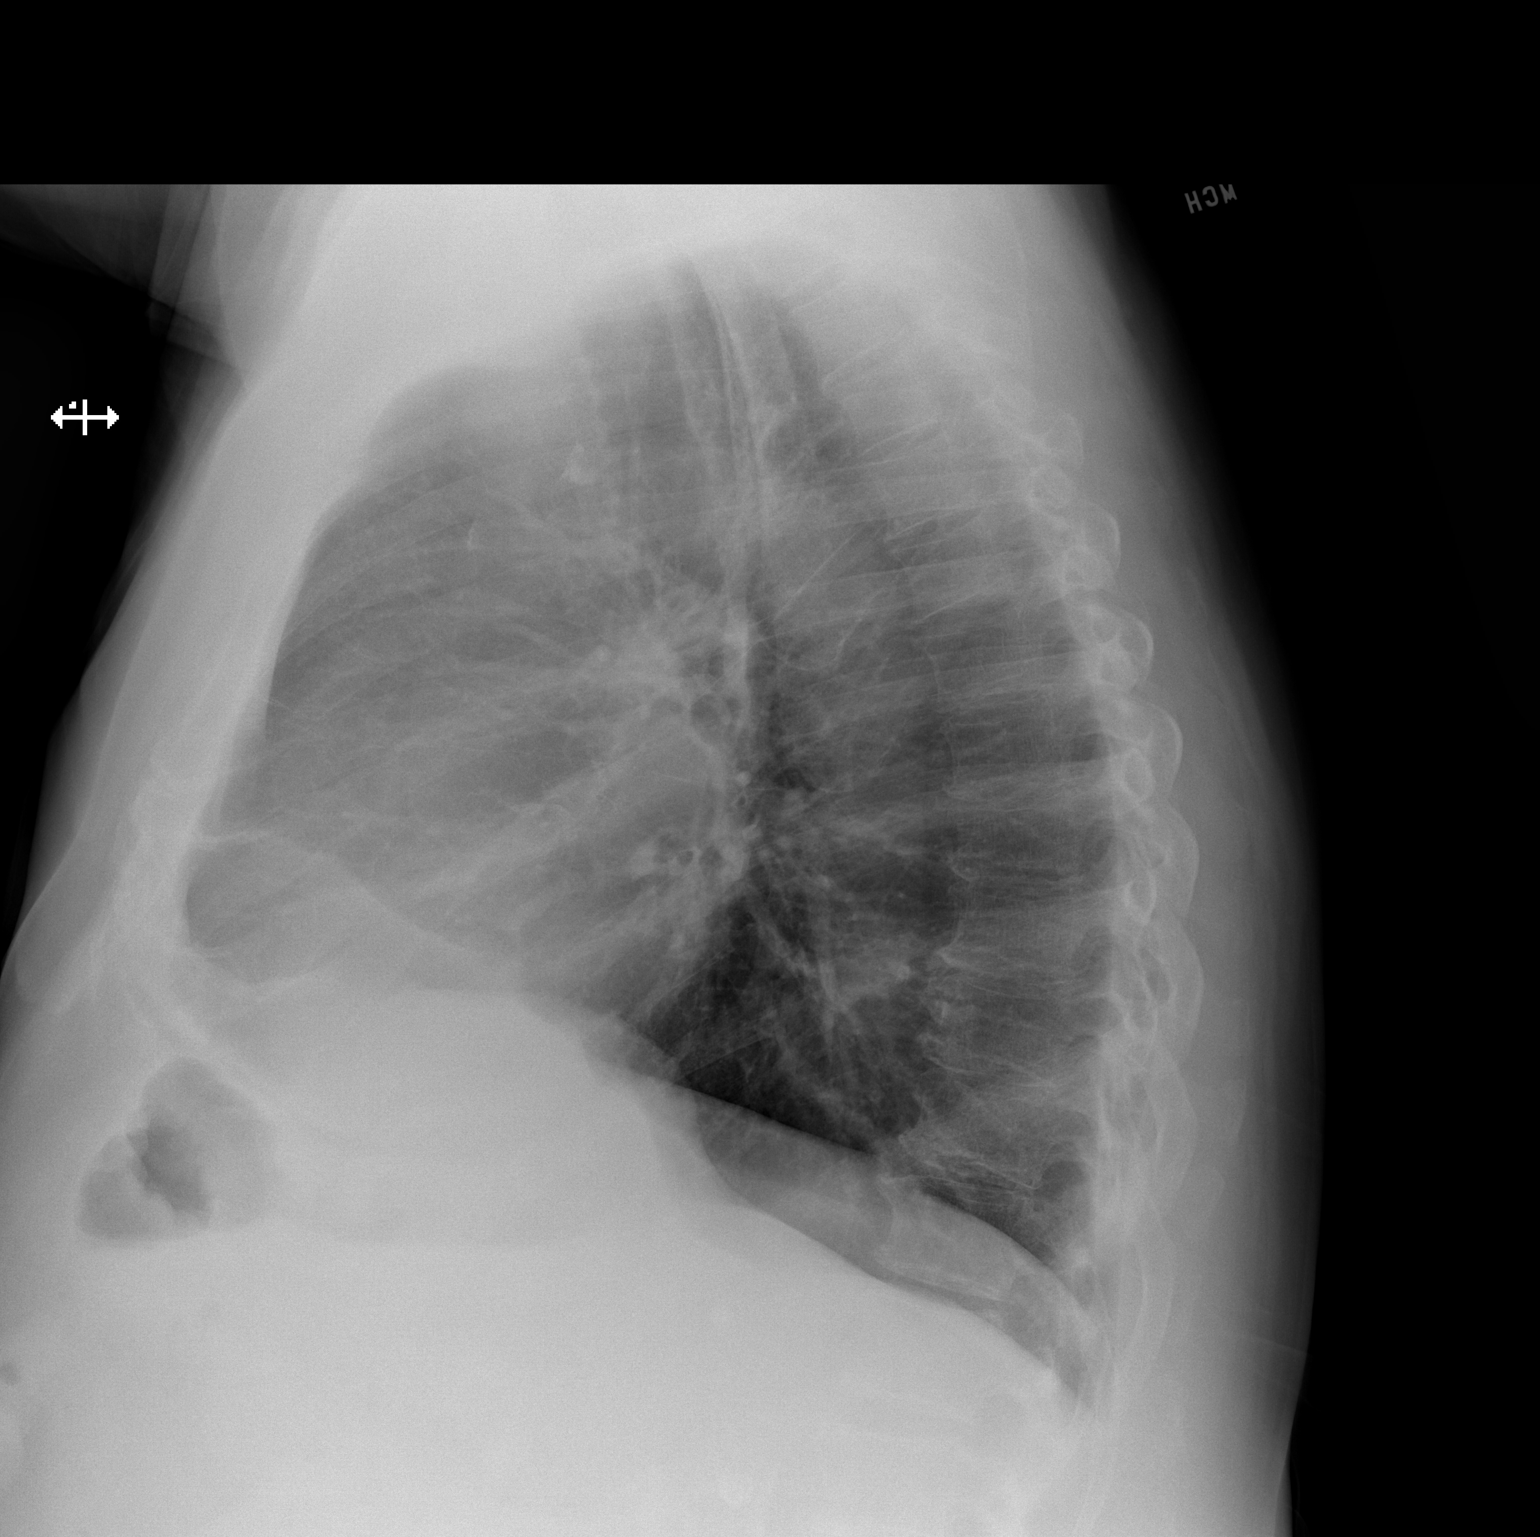

[2 of 2 positions shown; findings below may reference images not displayed]

IMPRESSION: No acute cardiopulmonary disease.

[REDACTED]

## 2015-04-22 NOTE — Op Note (Signed)
PATIENT NAME:  Russell Torres, Russell Torres MR#:  [id number] DATE OF BIRTH:  [birth year]  DATE OF PROCEDURE:  02/18/2013  PREOPERTATIVE DIAGNOSIS:  Metastatic squamous cell carcinoma.   POSTOPERATIVE DIAGNOSIS:  Metastatic squamous cell carcinoma.   OPERATION PERFORMED: Ultrasound-guided Port-A-Cath placement, left internal jugular vein.   SURGEON: Louis Matte, M.D.   ASSISTANT: None.   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 He was recommended to undergo chemotherapy and a Port-A-Cath is required. Because the patient would like to hunt and he is right-handed, he would like the Port-A-Cath placed on the left. The indications and risks were explained patient who gave informed consent.   DESCRIPTION OF PROCEDURE: The patient was brought to the operating suite and placed in the supine position. General anesthesia was given with laryngeal mask airway. The patient's head was turned to the right and the patient was prepped and draped in the usual sterile fashion. We began by ultrasounding the right neck and then percutaneously catheterizing the left internal jugular vein. A wire was passed through the needle and positioned on the right side of the heart. A skin incision was made on the anterior surface of the left chest. A port site was created. A tunneler was then passed from the port site up to the neck incision and the catheter was positioned through this subcutaneous tunnel. The catheter was flushed with heparinized saline. The catheter was then placed through a peel-away sheath. The catheter was positioned in the junction of the superior vena cava, right atrium. It flushed and irrigated nicely. The catheter was assembled and it was secured to the anterior chest wall with interrupted Prolene sutures on the corners. The catheter was flushed and irrigated. Again under fluoroscopic control, there was prompt  irrigation and flushing of the catheter. It was in good position. The wounds were then closed in multiple layers with running absorbable sutures. Sterile dressings were applied. The patient was awakened and taken to the recovery room in stable condition.     ____________________________ Lew Dawes Genevive Bi, MD teo:cc D: 02/23/2013 17:54:00 ET T: 02/23/2013 21:36:21 ET JOB#: [id number]  cc: Christia Reading E. Genevive Bi, MD, <Dictator>  Louis Matte MD ELECTRONICALLY SIGNED 02/24/2013 13:31

## 2015-04-22 NOTE — Op Note (Signed)
PATIENT NAME:  Russell Torres, Russell Torres MR#:  [id number] DATE OF BIRTH:  [birth year]  DATE OF PROCEDURE:  02/23/2013  PREOPERTATIVE DIAGNOSIS:  Metastatic squamous cell carcinoma.   POSTOPERATIVE DIAGNOSIS:  Metastatic squamous cell carcinoma.   OPERATION PERFORMED: Ultrasound-guided Port-A-Cath placement, left internal jugular vein.   SURGEON: Louis Matte, M.D.   ASSISTANT: None.   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 He was recommended to undergo chemotherapy and a Port-A-Cath is required. Because the patient would like to hunt and he is right-handed, he would like the Port-A-Cath placed on the left. The indications and risks were explained patient who gave informed consent.   DESCRIPTION OF PROCEDURE: The patient was brought to the operating suite and placed in the supine position. General anesthesia was given with laryngeal mask airway. The patient's head was turned to the right and the patient was prepped and draped in the usual sterile fashion. We began by ultrasounding the right neck and then percutaneously catheterizing the left internal jugular vein. A wire was passed through the needle and positioned on the right side of the heart. A skin incision was made on the anterior surface of the left chest. A port site was created. A tunneler was then passed from the port site up to the neck incision and the catheter was positioned through this subcutaneous tunnel. The catheter was flushed with heparinized saline. The catheter was then placed through a peel-away sheath. The catheter was positioned in the junction of the superior vena cava, right atrium. It flushed and irrigated nicely. The catheter was assembled and it was secured to the anterior chest wall with interrupted Prolene sutures on the corners. The catheter was flushed and irrigated. Again under fluoroscopic control, there was prompt  irrigation and flushing of the catheter. It was in good position. The wounds were then closed in multiple layers with running absorbable sutures. Sterile dressings were applied. The patient was awakened and taken to the recovery room in stable condition.     ____________________________ Lew Dawes Genevive Bi, MD teo:cc D: 02/23/2013 17:54:56 ET T: 02/23/2013 21:36:21 ET JOB#: [id number]  cc: Christia Reading E. Genevive Bi, MD, <Dictator>
